# Patient Record
Sex: Male | Born: 1985 | Race: Black or African American | Hispanic: No | Marital: Single | Smoking: Current every day smoker
Health system: Southern US, Community
[De-identification: ages and names within clinical notes are randomized; demographics above are authoritative.]

## PROBLEM LIST (undated history)

## (undated) HISTORY — PX: ROTATOR CUFF REPAIR: SHX139

---

## 2009-01-14 ENCOUNTER — Emergency Department (HOSPITAL_BASED_OUTPATIENT_CLINIC_OR_DEPARTMENT_OTHER): Admission: EM | Admit: 2009-01-14 | Discharge: 2009-01-14 | Payer: Self-pay | Admitting: Emergency Medicine

## 2009-02-21 ENCOUNTER — Inpatient Hospital Stay (HOSPITAL_COMMUNITY): Admission: AC | Admit: 2009-02-21 | Discharge: 2009-03-06 | Payer: Self-pay

## 2009-03-03 ENCOUNTER — Ambulatory Visit: Payer: Self-pay | Admitting: Physical Medicine & Rehabilitation

## 2009-03-06 ENCOUNTER — Ambulatory Visit: Payer: Self-pay | Admitting: Physical Medicine & Rehabilitation

## 2009-03-06 ENCOUNTER — Inpatient Hospital Stay (HOSPITAL_COMMUNITY)
Admission: RE | Admit: 2009-03-06 | Discharge: 2009-03-20 | Payer: Self-pay | Admitting: Physical Medicine & Rehabilitation

## 2009-03-07 ENCOUNTER — Ambulatory Visit: Payer: Self-pay | Admitting: Physical Medicine & Rehabilitation

## 2009-03-19 ENCOUNTER — Ambulatory Visit: Payer: Self-pay | Admitting: Psychology

## 2009-03-23 ENCOUNTER — Encounter
Admission: RE | Admit: 2009-03-23 | Discharge: 2009-06-09 | Payer: Self-pay | Admitting: Physical Medicine & Rehabilitation

## 2009-04-14 ENCOUNTER — Encounter
Admission: RE | Admit: 2009-04-14 | Discharge: 2009-05-13 | Payer: Self-pay | Admitting: Physical Medicine & Rehabilitation

## 2009-04-15 ENCOUNTER — Ambulatory Visit: Payer: Self-pay | Admitting: Physical Medicine & Rehabilitation

## 2009-04-28 ENCOUNTER — Ambulatory Visit: Payer: Self-pay | Admitting: Psychology

## 2009-06-10 ENCOUNTER — Encounter
Admission: RE | Admit: 2009-06-10 | Discharge: 2009-06-10 | Payer: Self-pay | Admitting: Physical Medicine & Rehabilitation

## 2009-06-10 ENCOUNTER — Ambulatory Visit: Payer: Self-pay | Admitting: Physical Medicine & Rehabilitation

## 2009-06-18 ENCOUNTER — Encounter
Admission: RE | Admit: 2009-06-18 | Discharge: 2009-07-30 | Payer: Self-pay | Admitting: Physical Medicine & Rehabilitation

## 2010-06-13 ENCOUNTER — Encounter: Payer: Self-pay | Admitting: Physical Medicine & Rehabilitation

## 2010-08-26 LAB — BASIC METABOLIC PANEL
BUN: 14 mg/dL (ref 6–23)
BUN: 13 mg/dL (ref 6–23)
BUN: 8 mg/dL (ref 6–23)
CO2: 30 mEq/L (ref 19–32)
CO2: 25 mEq/L (ref 19–32)
CO2: 27 mEq/L (ref 19–32)
CO2: 28 mEq/L (ref 19–32)
CO2: 29 mEq/L (ref 19–32)
Calcium: 8.2 mg/dL — ABNORMAL LOW (ref 8.4–10.5)
Calcium: 8.8 mg/dL (ref 8.4–10.5)
Chloride: 103 mEq/L (ref 96–112)
Chloride: 100 mEq/L (ref 96–112)
Chloride: 105 mEq/L (ref 96–112)
Chloride: 105 mEq/L (ref 96–112)
Chloride: 109 mEq/L (ref 96–112)
Creatinine, Ser: 0.73 mg/dL (ref 0.4–1.5)
Creatinine, Ser: 0.92 mg/dL (ref 0.4–1.5)
GFR calc Af Amer: >60 mL/min (ref >60)
GFR calc Af Amer: >60 mL/min (ref >60)
GFR calc Af Amer: >60 mL/min (ref >60)
GFR calc Af Amer: >60 mL/min (ref >60)
GFR calc Af Amer: >60 mL/min (ref >60)
GFR calc non Af Amer: >60 mL/min (ref >60)
GFR calc non Af Amer: >60 mL/min (ref >60)
GFR calc non Af Amer: >60 mL/min (ref >60)
Glucose, Bld: 132 mg/dL — ABNORMAL HIGH (ref 70–99)
Glucose, Bld: 135 mg/dL — ABNORMAL HIGH (ref 70–99)
Glucose, Bld: 148 mg/dL — ABNORMAL HIGH (ref 70–99)
Glucose, Bld: 171 mg/dL — ABNORMAL HIGH (ref 70–99)
Potassium: 3.9 mEq/L (ref 3.5–5.1)
Potassium: 3.9 mEq/L (ref 3.5–5.1)
Potassium: 4 mEq/L (ref 3.5–5.1)
Potassium: 4 mEq/L (ref 3.5–5.1)
Potassium: 4.2 mEq/L (ref 3.5–5.1)
Sodium: 138 mEq/L (ref 135–145)
Sodium: 139 mEq/L (ref 135–145)
Sodium: 140 mEq/L (ref 135–145)
Sodium: 141 mEq/L (ref 135–145)
Sodium: 144 mEq/L (ref 135–145)

## 2010-08-26 LAB — CULTURE, BLOOD (ROUTINE X 2)
Culture: NO GROWTH
Culture: NO GROWTH

## 2010-08-26 LAB — COMPREHENSIVE METABOLIC PANEL
Alkaline Phosphatase: 114 U/L (ref 39–117)
BUN: 22 mg/dL (ref 6–23)
Calcium: 9.6 mg/dL (ref 8.4–10.5)
Chloride: 101 mEq/L (ref 96–112)
GFR calc Af Amer: >60 mL/min (ref >60)
GFR calc non Af Amer: >60 mL/min (ref >60)
Total Bilirubin: 0.6 mg/dL (ref 0.3–1.2)
Total Protein: 7.6 g/dL (ref 6.0–8.3)

## 2010-08-26 LAB — COMPREHENSIVE METABOLIC PANEL WITH GFR
ALT: 21 U/L (ref 0–53)
AST: 41 U/L — ABNORMAL HIGH (ref 0–37)
Albumin: 2.3 g/dL — ABNORMAL LOW (ref 3.5–5.2)
Alkaline Phosphatase: 42 U/L (ref 39–117)
BUN: 4 mg/dL — ABNORMAL LOW (ref 6–23)
CO2: 27 meq/L (ref 19–32)
Calcium: 8.1 mg/dL — ABNORMAL LOW (ref 8.4–10.5)
Chloride: 110 meq/L (ref 96–112)
Creatinine, Ser: 0.87 mg/dL (ref 0.4–1.5)
GFR calc non Af Amer: >60 mL/min
Glucose, Bld: 164 mg/dL — ABNORMAL HIGH (ref 70–99)
Potassium: 2.9 meq/L — ABNORMAL LOW (ref 3.5–5.1)
Sodium: 142 meq/L (ref 135–145)
Total Bilirubin: 0.6 mg/dL (ref 0.3–1.2)
Total Protein: 5 g/dL — ABNORMAL LOW (ref 6.0–8.3)

## 2010-08-26 LAB — BLOOD GAS, ARTERIAL
Acid-Base Excess: 0.1 mmol/L (ref 0.0–2.0)
Acid-Base Excess: 0.8 mmol/L (ref 0.0–2.0)
Bicarbonate: 24.4 meq/L — ABNORMAL HIGH (ref 20.0–24.0)
Drawn by: 249101
FIO2: 0.3 %
FIO2: 0.4 %
MECHVT: 550 mL
MECHVT: 550 mL
MECHVT: 550 mL
O2 Saturation: 99.8 %
PEEP: 5 cmH2O
PEEP: 5 cmH2O
PEEP: 8 cmH2O
Patient temperature: 98.6
Patient temperature: 99.1
RATE: 16 resp/min
RATE: 16 resp/min
RATE: 16 {breaths}/min
TCO2: 25.5 mmol/L (ref 0–100)
pCO2 arterial: 32 mmHg — ABNORMAL LOW (ref 35.0–45.0)
pCO2 arterial: 35.8 mmHg (ref 35.0–45.0)
pCO2 arterial: 36.4 mmHg (ref 35.0–45.0)
pH, Arterial: 7.432 (ref 7.350–7.450)
pH, Arterial: 7.449 (ref 7.350–7.450)
pH, Arterial: 7.459 — ABNORMAL HIGH (ref 7.350–7.450)
pO2, Arterial: 159 mmHg — ABNORMAL HIGH (ref 80.0–100.0)
pO2, Arterial: 205 mmHg — ABNORMAL HIGH (ref 80.0–100.0)

## 2010-08-26 LAB — DIFFERENTIAL
Basophils Absolute: 0 10*3/uL (ref 0.0–0.1)
Eosinophils Relative: 1 % (ref 0–5)
Lymphocytes Relative: 15 % (ref 12–46)
Monocytes Absolute: 0.8 10*3/uL (ref 0.1–1.0)
Monocytes Relative: 9 % (ref 3–12)
Neutro Abs: 6.7 10*3/uL (ref 1.7–7.7)

## 2010-08-26 LAB — CBC
HCT: 32.9 % — ABNORMAL LOW (ref 39.0–52.0)
HCT: 22.5 % — ABNORMAL LOW (ref 39.0–52.0)
HCT: 26.2 % — ABNORMAL LOW (ref 39.0–52.0)
HCT: 26.4 % — ABNORMAL LOW (ref 39.0–52.0)
HCT: 27.7 % — ABNORMAL LOW (ref 39.0–52.0)
HCT: 30.1 % — ABNORMAL LOW (ref 39.0–52.0)
HCT: 30.4 % — ABNORMAL LOW (ref 39.0–52.0)
HCT: 31.4 % — ABNORMAL LOW (ref 39.0–52.0)
HCT: 31.4 % — ABNORMAL LOW (ref 39.0–52.0)
HCT: 31.5 % — ABNORMAL LOW (ref 39.0–52.0)
HCT: 31.6 % — ABNORMAL LOW (ref 39.0–52.0)
HCT: 32.9 % — ABNORMAL LOW (ref 39.0–52.0)
HCT: 33.2 % — ABNORMAL LOW (ref 39.0–52.0)
HCT: 33.3 % — ABNORMAL LOW (ref 39.0–52.0)
HCT: 38.9 % — ABNORMAL LOW (ref 39.0–52.0)
Hemoglobin: 11.3 g/dL — ABNORMAL LOW (ref 13.0–17.0)
Hemoglobin: 10.6 g/dL — ABNORMAL LOW (ref 13.0–17.0)
Hemoglobin: 10.8 g/dL — ABNORMAL LOW (ref 13.0–17.0)
Hemoglobin: 10.8 g/dL — ABNORMAL LOW (ref 13.0–17.0)
Hemoglobin: 11 g/dL — ABNORMAL LOW (ref 13.0–17.0)
Hemoglobin: 11.2 g/dL — ABNORMAL LOW (ref 13.0–17.0)
Hemoglobin: 11.2 g/dL — ABNORMAL LOW (ref 13.0–17.0)
Hemoglobin: 11.4 g/dL — ABNORMAL LOW (ref 13.0–17.0)
Hemoglobin: 11.6 g/dL — ABNORMAL LOW (ref 13.0–17.0)
Hemoglobin: 11.6 g/dL — ABNORMAL LOW (ref 13.0–17.0)
Hemoglobin: 13.4 g/dL (ref 13.0–17.0)
Hemoglobin: 7.9 g/dL — CL (ref 13.0–17.0)
Hemoglobin: 9.1 g/dL — ABNORMAL LOW (ref 13.0–17.0)
Hemoglobin: 9.2 g/dL — ABNORMAL LOW (ref 13.0–17.0)
Hemoglobin: 9.3 g/dL — ABNORMAL LOW (ref 13.0–17.0)
Hemoglobin: 9.5 g/dL — ABNORMAL LOW (ref 13.0–17.0)
MCHC: 34.4 g/dL (ref 30.0–36.0)
MCHC: 33.8 g/dL (ref 30.0–36.0)
MCHC: 34 g/dL (ref 30.0–36.0)
MCHC: 34.3 g/dL (ref 30.0–36.0)
MCHC: 34.4 g/dL (ref 30.0–36.0)
MCHC: 34.4 g/dL (ref 30.0–36.0)
MCHC: 34.5 g/dL (ref 30.0–36.0)
MCHC: 34.5 g/dL (ref 30.0–36.0)
MCHC: 34.6 g/dL (ref 30.0–36.0)
MCHC: 34.7 g/dL (ref 30.0–36.0)
MCHC: 34.7 g/dL (ref 30.0–36.0)
MCHC: 35 g/dL (ref 30.0–36.0)
MCHC: 35 g/dL (ref 30.0–36.0)
MCHC: 35 g/dL (ref 30.0–36.0)
MCHC: 35.3 g/dL (ref 30.0–36.0)
MCV: 91.1 fL (ref 78.0–100.0)
MCV: 92.8 fL (ref 78.0–100.0)
MCV: 90.5 fL (ref 78.0–100.0)
MCV: 90.8 fL (ref 78.0–100.0)
MCV: 90.8 fL (ref 78.0–100.0)
MCV: 91.4 fL (ref 78.0–100.0)
MCV: 91.4 fL (ref 78.0–100.0)
MCV: 91.4 fL (ref 78.0–100.0)
MCV: 91.5 fL (ref 78.0–100.0)
MCV: 91.5 fL (ref 78.0–100.0)
MCV: 91.6 fL (ref 78.0–100.0)
MCV: 91.6 fL (ref 78.0–100.0)
MCV: 92.1 fL (ref 78.0–100.0)
MCV: 92.4 fL (ref 78.0–100.0)
MCV: 92.5 fL (ref 78.0–100.0)
MCV: 92.5 fL (ref 78.0–100.0)
MCV: 93 fL (ref 78.0–100.0)
MCV: 93.2 fL (ref 78.0–100.0)
MCV: 93.3 fL (ref 78.0–100.0)
Platelets: 298 10*3/uL (ref 150–400)
Platelets: 531 10*3/uL — ABNORMAL HIGH (ref 150–400)
Platelets: 118 K/uL — ABNORMAL LOW (ref 150–400)
Platelets: 125 K/uL — ABNORMAL LOW (ref 150–400)
Platelets: 130 K/uL — ABNORMAL LOW (ref 150–400)
Platelets: 136 K/uL — ABNORMAL LOW (ref 150–400)
Platelets: 148 10*3/uL — ABNORMAL LOW (ref 150–400)
Platelets: 148 10*3/uL — ABNORMAL LOW (ref 150–400)
Platelets: 224 K/uL (ref 150–400)
Platelets: 285 K/uL (ref 150–400)
Platelets: 287 K/uL (ref 150–400)
Platelets: 323 K/uL (ref 150–400)
Platelets: 357 10*3/uL (ref 150–400)
Platelets: 370 K/uL (ref 150–400)
Platelets: 376 10*3/uL (ref 150–400)
Platelets: 422 10*3/uL — ABNORMAL HIGH (ref 150–400)
RBC: 3.55 MIL/uL — ABNORMAL LOW (ref 4.22–5.81)
RBC: 2.46 MIL/uL — ABNORMAL LOW (ref 4.22–5.81)
RBC: 2.81 MIL/uL — ABNORMAL LOW (ref 4.22–5.81)
RBC: 2.89 MIL/uL — ABNORMAL LOW (ref 4.22–5.81)
RBC: 2.89 MIL/uL — ABNORMAL LOW (ref 4.22–5.81)
RBC: 2.98 MIL/uL — ABNORMAL LOW (ref 4.22–5.81)
RBC: 3.32 MIL/uL — ABNORMAL LOW (ref 4.22–5.81)
RBC: 3.44 MIL/uL — ABNORMAL LOW (ref 4.22–5.81)
RBC: 3.45 MIL/uL — ABNORMAL LOW (ref 4.22–5.81)
RBC: 3.45 MIL/uL — ABNORMAL LOW (ref 4.22–5.81)
RBC: 3.51 MIL/uL — ABNORMAL LOW (ref 4.22–5.81)
RBC: 3.56 MIL/uL — ABNORMAL LOW (ref 4.22–5.81)
RBC: 3.62 MIL/uL — ABNORMAL LOW (ref 4.22–5.81)
RBC: 3.63 MIL/uL — ABNORMAL LOW (ref 4.22–5.81)
RBC: 3.66 MIL/uL — ABNORMAL LOW (ref 4.22–5.81)
RDW: 13.6 % (ref 11.5–15.5)
RDW: 12.8 % (ref 11.5–15.5)
RDW: 12.9 % (ref 11.5–15.5)
RDW: 13 % (ref 11.5–15.5)
RDW: 13 % (ref 11.5–15.5)
RDW: 13 % (ref 11.5–15.5)
RDW: 13.3 % (ref 11.5–15.5)
RDW: 13.4 % (ref 11.5–15.5)
RDW: 13.5 % (ref 11.5–15.5)
RDW: 13.5 % (ref 11.5–15.5)
RDW: 13.6 % (ref 11.5–15.5)
RDW: 13.6 % (ref 11.5–15.5)
RDW: 13.6 % (ref 11.5–15.5)
RDW: 13.8 % (ref 11.5–15.5)
RDW: 14 % (ref 11.5–15.5)
RDW: 14 % (ref 11.5–15.5)
WBC: 6 10*3/uL (ref 4.0–10.5)
WBC: 8.9 10*3/uL (ref 4.0–10.5)
WBC: 10.2 10*3/uL (ref 4.0–10.5)
WBC: 10.7 K/uL — ABNORMAL HIGH (ref 4.0–10.5)
WBC: 10.8 K/uL — ABNORMAL HIGH (ref 4.0–10.5)
WBC: 10.9 K/uL — ABNORMAL HIGH (ref 4.0–10.5)
WBC: 11.8 10*3/uL — ABNORMAL HIGH (ref 4.0–10.5)
WBC: 12.1 K/uL — ABNORMAL HIGH (ref 4.0–10.5)
WBC: 12.4 K/uL — ABNORMAL HIGH (ref 4.0–10.5)
WBC: 12.5 10*3/uL — ABNORMAL HIGH (ref 4.0–10.5)
WBC: 14.1 K/uL — ABNORMAL HIGH (ref 4.0–10.5)
WBC: 7.6 10*3/uL (ref 4.0–10.5)
WBC: 8.2 10*3/uL (ref 4.0–10.5)
WBC: 8.8 K/uL (ref 4.0–10.5)
WBC: 8.9 K/uL (ref 4.0–10.5)
WBC: 9.6 K/uL (ref 4.0–10.5)

## 2010-08-26 LAB — URINALYSIS, ROUTINE W REFLEX MICROSCOPIC
Bilirubin Urine: NEGATIVE
Bilirubin Urine: NEGATIVE
Glucose, UA: NEGATIVE mg/dL
Glucose, UA: NEGATIVE mg/dL
Hgb urine dipstick: NEGATIVE
Ketones, ur: NEGATIVE mg/dL
Nitrite: NEGATIVE
Protein, ur: NEGATIVE mg/dL
Protein, ur: NEGATIVE mg/dL
Specific Gravity, Urine: 1.026 (ref 1.005–1.030)
Urobilinogen, UA: 0.2 mg/dL (ref 0.0–1.0)
Urobilinogen, UA: 1 mg/dL (ref 0.0–1.0)
pH: 6 (ref 5.0–8.0)

## 2010-08-26 LAB — POCT I-STAT, CHEM 8
Calcium, Ion: 1.03 mmol/L — ABNORMAL LOW (ref 1.12–1.32)
Creatinine, Ser: 1 mg/dL (ref 0.4–1.5)
Glucose, Bld: 162 mg/dL — ABNORMAL HIGH (ref 70–99)
Hemoglobin: 13.9 g/dL (ref 13.0–17.0)
Sodium: 138 meq/L (ref 135–145)
TCO2: 22 mmol/L (ref 0–100)

## 2010-08-26 LAB — TYPE AND SCREEN

## 2010-08-26 LAB — POCT I-STAT 3, ART BLOOD GAS (G3+)
Acid-base deficit: 2 mmol/L (ref 0.0–2.0)
O2 Saturation: 100 %
Patient temperature: 37

## 2010-08-26 LAB — BASIC METABOLIC PANEL WITH GFR
BUN: 21 mg/dL (ref 6–23)
BUN: 5 mg/dL — ABNORMAL LOW (ref 6–23)
CO2: 25 meq/L (ref 19–32)
CO2: 27 meq/L (ref 19–32)
Calcium: 8.5 mg/dL (ref 8.4–10.5)
Calcium: 9.3 mg/dL (ref 8.4–10.5)
Chloride: 105 meq/L (ref 96–112)
Chloride: 114 meq/L — ABNORMAL HIGH (ref 96–112)
Creatinine, Ser: 0.84 mg/dL (ref 0.4–1.5)
Creatinine, Ser: 1.03 mg/dL (ref 0.4–1.5)
GFR calc non Af Amer: >60 mL/min
GFR calc non Af Amer: >60 mL/min
Glucose, Bld: 102 mg/dL — ABNORMAL HIGH (ref 70–99)
Glucose, Bld: 114 mg/dL — ABNORMAL HIGH (ref 70–99)
Potassium: 3.9 meq/L (ref 3.5–5.1)
Potassium: 4.1 meq/L (ref 3.5–5.1)
Sodium: 140 meq/L (ref 135–145)
Sodium: 143 meq/L (ref 135–145)

## 2010-08-26 LAB — URINE CULTURE
Colony Count: NO GROWTH
Culture: NO GROWTH

## 2010-08-26 LAB — URINE MICROSCOPIC-ADD ON

## 2010-08-26 LAB — TRIGLYCERIDES: Triglycerides: 261 mg/dL — ABNORMAL HIGH

## 2010-08-26 LAB — PROTIME-INR
INR: 1.4 (ref 0.00–1.49)
Prothrombin Time: 16.8 seconds — ABNORMAL HIGH (ref 11.6–15.2)

## 2010-08-26 LAB — CULTURE, BAL-QUANTITATIVE W GRAM STAIN

## 2010-08-26 LAB — CATH TIP CULTURE: Culture: NO GROWTH

## 2010-08-26 LAB — LACTIC ACID, PLASMA: Lactic Acid, Venous: 3.9 mmol/L — ABNORMAL HIGH (ref 0.5–2.2)
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
## 2017-04-24 ENCOUNTER — Encounter (HOSPITAL_BASED_OUTPATIENT_CLINIC_OR_DEPARTMENT_OTHER): Payer: Self-pay | Admitting: Emergency Medicine

## 2017-04-24 ENCOUNTER — Other Ambulatory Visit: Payer: Self-pay

## 2017-04-24 ENCOUNTER — Emergency Department (HOSPITAL_BASED_OUTPATIENT_CLINIC_OR_DEPARTMENT_OTHER)
Admission: EM | Admit: 2017-04-24 | Discharge: 2017-04-24 | Disposition: A | Discharge status: Home / Self Care | Payer: Self-pay | Attending: Emergency Medicine | Admitting: Emergency Medicine

## 2017-04-24 DIAGNOSIS — L0231 Cutaneous abscess of buttock: Secondary | ICD-10-CM | POA: Insufficient documentation

## 2017-04-24 DIAGNOSIS — F17200 Nicotine dependence, unspecified, uncomplicated: Secondary | ICD-10-CM | POA: Insufficient documentation

## 2017-04-24 MED ORDER — LIDOCAINE-EPINEPHRINE 2 %-1:100000 IJ SOLN
20.0000 mL | Freq: Once | INTRAMUSCULAR | Status: AC
  Administered 2017-04-24: 1 mL
  Filled 2017-04-24: qty 1

## 2017-04-24 MED ORDER — DOXYCYCLINE HYCLATE 100 MG PO CAPS: 100.0000 mg | ORAL_CAPSULE | Freq: Two times a day (BID) | ORAL | 0 refills | Status: AC

## 2017-04-24 MED ORDER — ACETAMINOPHEN 500 MG PO TABS
1000.0000 mg | ORAL_TABLET | Freq: Once | ORAL | Status: AC
  Administered 2017-04-24: 1000 mg via ORAL
  Filled 2017-04-24: qty 2

## 2017-04-24 NOTE — ED Provider Notes (Signed)
[facility] EMERGENCY DEPARTMENT Provider Note   CSN: [id number] Arrival date & time: 04/24/17  1547     History   Chief Complaint Chief Complaint  Patient presents with  . Abscess    HPI Malik Reed is a 31 y.o. male presents to the ED for evaluation of oral to right buttock 5 days. Associated symptoms include swelling, redness, warmth, pain and drainage. Has tried putting a warm rag over it. No previous history of abscesses in the past. Subjective fever and chills yesterday. No pain, bleeding or purulent discharge with bowel movements. Worse with palpation and sitting down. No alleviating factors.  HPI  History reviewed. No pertinent past medical history.  There are no active problems to display for this patient.   Past Surgical History:  Procedure Laterality Date  . ROTATOR CUFF REPAIR         Home Medications    Prior to Admission medications   Medication Sig Start Date End Date Taking? Authorizing Provider  doxycycline (VIBRAMYCIN) 100 MG capsule Take 1 capsule (100 mg total) by mouth 2 (two) times daily. 04/24/17   Liberty HandyGibbons, Jequan Shahin J, PA-C    Family History No family history on file.  Social History Social History   Tobacco Use  . Smoking status: Current Every Day Smoker  . Smokeless tobacco: Never Used  Substance Use Topics  . Alcohol use: Yes  . Drug use: No     Allergies   Penicillins   Review of Systems Review of Systems  Constitutional: Positive for chills and fever (subjective).  Skin: Positive for color change.       +boil   All other systems reviewed and are negative.    Physical Exam Updated Vital Signs BP 127/67 (BP Location: Left Arm)   Pulse (!) 104   Temp 99.2 F (37.3 C) (Oral)   Resp 20   Ht 5\' 8"  (1.727 m)   Wt 79.4 kg (175 lb)   SpO2 100%   BMI 26.61 kg/m   Physical Exam  Constitutional: He is oriented to person, place, and time. He appears well-developed and well-nourished. No distress.  NAD.    HENT:  Head: Normocephalic and atraumatic.  Right Ear: External ear normal.  Left Ear: External ear normal.  Nose: Nose normal.  Eyes: Conjunctivae and EOM are normal. No scleral icterus.  Neck: Normal range of motion. Neck supple.  Cardiovascular: Normal rate, regular rhythm, normal heart sounds and intact distal pulses.  No murmur heard. Pulmonary/Chest: Effort normal and breath sounds normal. He has no wheezes.  Abdominal: Soft. There is no tenderness.  Musculoskeletal: Normal range of motion. He exhibits no deformity.  Neurological: He is alert and oriented to person, place, and time.  Skin: Skin is warm and dry. Capillary refill takes less than 2 seconds.  Abscess to right buttock actively draining bloody discharge with surrounding 4 x 4 area of induration, warmth, erythema and tenderness. No tenderness in the perianal area.  Psychiatric: He has a normal mood and affect. His behavior is normal. Judgment and thought content normal.  Nursing note and vitals reviewed.    ED Treatments / Results  Labs (all labs ordered are listed, but only abnormal results are displayed) Labs Reviewed - No data to display  EKG  EKG Interpretation None       Radiology No results found.  Procedures .Marland Kitchen.Incision and Drainage Date/Time: 04/24/2017 5:22 PM Performed by: Liberty HandyGibbons, Lafonda Patron J, PA-C Authorized by: Liberty HandyGibbons, Christophere Hillhouse J, PA-C   Consent:  Consent obtained:  Verbal   Consent given by:  Patient   Risks discussed:  Bleeding, incomplete drainage, infection and pain   Alternatives discussed:  Observation Location:    Type:  Abscess   Size:  4x4   Location:  Anogenital   Anogenital location: right buttock. Pre-procedure details:    Skin preparation:  Betadine Anesthesia (see MAR for exact dosages):    Anesthesia method:  Local infiltration   Local anesthetic:  Lidocaine 2% WITH epi Procedure type:    Complexity:  Simple Procedure details:    Needle aspiration: no     Incision  types:  Single straight   Incision depth:  Dermal   Scalpel blade:  11   Wound management:  Probed and deloculated   Drainage:  Purulent and bloody   Drainage amount:  Moderate   Packing materials:  1/4 in gauze Post-procedure details:    Patient tolerance of procedure:  Tolerated well, no immediate complications    (including critical care time)  Medications Ordered in ED Medications  lidocaine-EPINEPHrine (XYLOCAINE W/EPI) 2 %-1:100000 (with pres) injection 20 mL (1 mL Infiltration Given 04/24/17 1721)  acetaminophen (TYLENOL) tablet 1,000 mg (1,000 mg Oral Given 04/24/17 1721)     Initial Impression / Assessment and Plan / ED Course  I have reviewed the triage vital signs and the nursing notes.  Pertinent labs & imaging results that were available during my care of the patient were reviewed by me and considered in my medical decision making (see chart for details).    31 y.o. yo male with abscess to right buttock. There is surrounding induration and cellulitis after drainage.  He reports subjective fevers and chills last night. No fever in ED. I&D performed in ED with adequate evacuation of purulent discharge.  Will d/c with antibiotics. Packing in place. Adivsed f/u in 2 days for wound check and packing removal. Will discharge with warm compresses, NSAIDs and I&D care instructions. Patient aware of symptoms that would warrant return to ED for re-evaluation and treatment. Patient verbalized understanding and is agreeable with plan.    Final Clinical Impressions(s) / ED Diagnoses   Final diagnoses:  Abscess of buttock, right    ED Discharge Orders        Ordered    doxycycline (VIBRAMYCIN) 100 MG capsule  2 times daily     04/24/17 1753       Liberty HandyGibbons, Kasidi Shanker J, PA-C 04/24/17 1755    Charlynne PanderYao, David Hsienta, MD 04/24/17 2328

## 2017-04-24 NOTE — ED Triage Notes (Signed)
PT presents with c/o abscess to buttocks that he noticed Friday.

## 2017-04-24 NOTE — Discharge Instructions (Signed)
Your abscess was drained today. There was some remaining swelling after procedure. Take antibiotic as prescribed. For pain and swelling, take 1000 mg tylenol PLUS 600 mg ibuprofen every 6-8 hours. Apply warm/heat to area either with warm rag, heating pad or sitz baths.   Return for wound check and packing removal in 2 days. Return earlier if swelling worsens, if you develop pain with bowel movements.

## 2017-04-26 ENCOUNTER — Emergency Department (HOSPITAL_BASED_OUTPATIENT_CLINIC_OR_DEPARTMENT_OTHER)
Admission: EM | Admit: 2017-04-26 | Discharge: 2017-04-26 | Disposition: A | Discharge status: Home / Self Care | Payer: Self-pay | Attending: Emergency Medicine | Admitting: Emergency Medicine

## 2017-04-26 ENCOUNTER — Other Ambulatory Visit: Payer: Self-pay

## 2017-04-26 ENCOUNTER — Encounter (HOSPITAL_BASED_OUTPATIENT_CLINIC_OR_DEPARTMENT_OTHER): Payer: Self-pay | Admitting: Emergency Medicine

## 2017-04-26 DIAGNOSIS — F17200 Nicotine dependence, unspecified, uncomplicated: Secondary | ICD-10-CM | POA: Insufficient documentation

## 2017-04-26 DIAGNOSIS — Z5189 Encounter for other specified aftercare: Secondary | ICD-10-CM | POA: Insufficient documentation

## 2017-04-26 DIAGNOSIS — L0231 Cutaneous abscess of buttock: Secondary | ICD-10-CM | POA: Insufficient documentation

## 2017-04-26 NOTE — ED Provider Notes (Signed)
[facility] EMERGENCY DEPARTMENT Provider Note   CSN: [id number] Arrival date & time: 04/26/17  1549     History   Chief Complaint Chief Complaint  Patient presents with  . Wound Check    HPI Malik Reed is a 31 y.o. male.  HPI Malik Reed is a 31 y.o. male presents to emergency department for wound recheck.  Patient had an abscess on his right buttock and drained 2 days ago.  Patient was started on antibiotics but he admits that he has not started taking them yet.  He has not done any wound care at home.  He states abscess is still draining, however pain improved.  Denies any fever or chills.  Pain is worsened with sitting down and palpating the abscess.  Nothing making it better.  His girlfriend states "it looks better."  Packing came out today.  History reviewed. No pertinent past medical history.  There are no active problems to display for this patient.   Past Surgical History:  Procedure Laterality Date  . ROTATOR CUFF REPAIR         Home Medications    Prior to Admission medications   Medication Sig Start Date End Date Taking? Authorizing Provider  doxycycline (VIBRAMYCIN) 100 MG capsule Take 1 capsule (100 mg total) by mouth 2 (two) times daily. 04/24/17   Liberty HandyGibbons, Claudia J, PA-C    Family History No family history on file.  Social History Social History   Tobacco Use  . Smoking status: Current Every Day Smoker  . Smokeless tobacco: Never Used  Substance Use Topics  . Alcohol use: Yes  . Drug use: No     Allergies   Penicillins   Review of Systems Review of Systems  Constitutional: Negative for chills and fever.  Musculoskeletal: Negative for arthralgias and myalgias.  Skin: Positive for wound.  Neurological: Negative for headaches.     Physical Exam Updated Vital Signs BP 118/68   Pulse 68   Temp 98.2 F (36.8 C) (Oral)   Resp 16   Ht 5\' 8"  (1.727 m)   Wt 79.4 kg (175 lb)   SpO2 100%   BMI 26.61 kg/m   Physical  Exam  Constitutional: He appears well-developed and well-nourished. No distress.  Eyes: Conjunctivae are normal.  Neck: Neck supple.  Cardiovascular: Normal rate.  Pulmonary/Chest: No respiratory distress.  Abdominal: He exhibits no distension.  Skin: Skin is warm and dry.  3 x 3 cm open abscess to the right buttock, just lateral to the rectum, actively draining bloody purulent drainage.  No surrounding erythema or cell area is indurated, some drainage still present upon palpation.  No packing in place.  Nursing note and vitals reviewed.    ED Treatments / Results  Labs (all labs ordered are listed, but only abnormal results are displayed) Labs Reviewed - No data to display  EKG  EKG Interpretation None       Radiology No results found.  Procedures Procedures (including critical care time)  Medications Ordered in ED Medications - No data to display   Initial Impression / Assessment and Plan / ED Course  I have reviewed the triage vital signs and the nursing notes.  Pertinent labs & imaging results that were available during my care of the patient were reviewed by me and considered in my medical decision making (see chart for details).     Patient with draining abscess, incised and drained 2 days ago.  There is no surrounding erythema or evidence  of cellulitis or spreading infection.  Abscess is still draining.  Packing fell out on its own today.  He is afebrile, nontoxic-appearing, normal vital signs.  Discussed wound care, including soaks, warm compresses.  Advised to start taking antibiotics.  Follow-up as needed.  Vitals:   04/26/17 1601 04/26/17 1602  BP:  118/68  Pulse: 68   Resp: 16   Temp: 98.2 F (36.8 C)   TempSrc: Oral   SpO2: 100%   Weight: 79.4 kg (175 lb)   Height: 5\' 8"  (1.727 m)     Final Clinical Impressions(s) / ED Diagnoses   Final diagnoses:  Visit for wound check  Wound check, abscess    ED Discharge Orders    None         Jaynie CrumbleKirichenko, Darlena Koval, PA-C 04/26/17 1636    Rolland PorterJames, Mark, MD 05/03/17 1520

## 2017-04-26 NOTE — Discharge Instructions (Signed)
Make sure to start and take antibiotics.  Wound care at home, including bath soaks, warm compresses, dressing changes.  Follow-up here as needed.

## 2017-04-26 NOTE — ED Triage Notes (Signed)
Pt had abscess on buttock drained on 12/3 and told to come back for reeval in 2 days.
